# Patient Record
Sex: Female | Born: 1995 | Race: Black or African American | Hispanic: No | Marital: Single | Smoking: Never smoker
Health system: Southern US, Community
[De-identification: ages and names within clinical notes are randomized; demographics above are authoritative.]

## PROBLEM LIST (undated history)

## (undated) HISTORY — PX: OOPHORECTOMY: SHX86

---

## 2015-12-29 ENCOUNTER — Encounter (HOSPITAL_COMMUNITY): Payer: Self-pay | Admitting: Emergency Medicine

## 2015-12-29 ENCOUNTER — Emergency Department (HOSPITAL_COMMUNITY)
Admission: EM | Admit: 2015-12-29 | Discharge: 2015-12-29 | Disposition: A | Discharge status: Home / Self Care | Payer: BC Managed Care – PPO | Attending: Emergency Medicine | Admitting: Emergency Medicine

## 2015-12-29 ENCOUNTER — Emergency Department (HOSPITAL_COMMUNITY): Payer: BC Managed Care – PPO

## 2015-12-29 DIAGNOSIS — S8991XA Unspecified injury of right lower leg, initial encounter: Secondary | ICD-10-CM | POA: Diagnosis present

## 2015-12-29 DIAGNOSIS — M9251 Juvenile osteochondrosis of tibia and fibula, right leg: Secondary | ICD-10-CM | POA: Diagnosis not present

## 2015-12-29 DIAGNOSIS — S8001XA Contusion of right knee, initial encounter: Secondary | ICD-10-CM | POA: Insufficient documentation

## 2015-12-29 DIAGNOSIS — Y999 Unspecified external cause status: Secondary | ICD-10-CM | POA: Diagnosis not present

## 2015-12-29 DIAGNOSIS — Y929 Unspecified place or not applicable: Secondary | ICD-10-CM | POA: Insufficient documentation

## 2015-12-29 DIAGNOSIS — Y9302 Activity, running: Secondary | ICD-10-CM | POA: Insufficient documentation

## 2015-12-29 DIAGNOSIS — M25561 Pain in right knee: Secondary | ICD-10-CM

## 2015-12-29 DIAGNOSIS — M92521 Juvenile osteochondrosis of tibia tubercle, right leg: Secondary | ICD-10-CM

## 2015-12-29 DIAGNOSIS — M76891 Other specified enthesopathies of right lower limb, excluding foot: Secondary | ICD-10-CM | POA: Insufficient documentation

## 2015-12-29 DIAGNOSIS — W010XXA Fall on same level from slipping, tripping and stumbling without subsequent striking against object, initial encounter: Secondary | ICD-10-CM | POA: Insufficient documentation

## 2015-12-29 DIAGNOSIS — M779 Enthesopathy, unspecified: Secondary | ICD-10-CM

## 2015-12-29 MED ORDER — IBUPROFEN 400 MG PO TABS
800.0000 mg | ORAL_TABLET | Freq: Once | ORAL | Status: AC
  Administered 2015-12-29: 800 mg via ORAL
  Filled 2015-12-29: qty 2

## 2015-12-29 NOTE — Discharge Instructions (Signed)
Wear knee sleeve for at least 2 weeks for stabilization of knee. Use crutches as needed for comfort. Ice and elevate knee throughout the day, using ice pack for no more than 20 minutes every hour. Alternate between tylenol and ibuprofen for pain relief. Call orthopedic follow up today or tomorrow to schedule followup appointment for recheck of ongoing knee pain in 1 week for ongoing management of your knee pain/injury. Return to the ER for changes or worsening symptoms.

## 2015-12-29 NOTE — ED Provider Notes (Signed)
[facility]-EMERGENCY DEPT Provider Note   CSN: [id number] Arrival date & time: 12/29/15  1509  By signing my name below, I, Placido Sou, attest that this documentation has been prepared under the direction and in the presence of Irl Bodie Camprubi-Soms, PA-C. Electronically Signed: Placido Sou, ED Scribe. 12/29/15. 3:28 PM.   History   Chief Complaint Chief Complaint  Patient presents with  . Knee Pain   HPI HPI Comments: Jillian Mendoza is a 20 y.o. female with a PMHx of ?osgood-schlatter's (hx of prominent tibial tuberosity and knee pain when she was a child, played basketball as a kid), who presents to the Emergency Department complaining of a mechanical fall that occurred PTA. 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 She reports associated constant throbbing, non-radiating, 8/10, right knee pain that worsens when bending the knee and standing and with no tx tried PTA. Associated symptoms includes mild swelling to the right knee. Pt reports a h/o right knee pain as a kid, had a prominent tibial tuberosity but no formal diagnosis in the past, played basketball. She denies numbness, tingling, weakness, and bruising. Denies any other complaints at this time. No other injuries sustained during the fall.   The history is provided by the patient. No language interpreter was used.  Knee Pain   This is a new problem. The current episode started less than 1 hour ago. The problem occurs constantly. The problem has not changed since onset.The pain is present in the right knee. The quality of the pain is described as aching. The pain is at a severity of 8/10. The pain is moderate. Pertinent negatives include no numbness, full range of motion and no tingling. The symptoms are aggravated by activity and standing. She has tried nothing for the symptoms. The treatment provided no relief. There has been no history of extremity trauma.     No past medical history on file.  There are no active problems to display for this patient.   No past surgical history on file.  OB History    No data available       Home Medications    Prior to Admission medications   Not on File    Family History No family history on file.  Social History Social History  Substance Use Topics  . Smoking status: Not on file  . Smokeless tobacco: Not on file  . Alcohol use Not on file     Allergies   Review of patient's allergies indicates not on file.   Review of Systems Review of Systems  Musculoskeletal: Positive for arthralgias and joint swelling.  Skin: Negative for color change and wound.  Allergic/Immunologic: Negative for immunocompromised state.  Neurological: Negative for tingling, weakness and numbness.   A complete 10 system review of systems was obtained and all systems are negative except as noted in the HPI and PMH.   Physical Exam Updated Vital Signs BP 136/68 (BP Location: Right Arm)   Pulse 79   Temp 98.2 F (36.8 C) (Oral)   Resp 18   Ht 5\' 10"  (1.778 m)   Wt 280 lb (127 kg)   LMP 12/01/2015   SpO2 100%   BMI 40.18 kg/m   Physical Exam  Constitutional: She is oriented to person, place, and time. Vital signs are normal. She appears well-developed and well-nourished.  Non-toxic appearance. No distress.  Afebrile, nontoxic, NAD  HENT:  Head: Normocephalic and atraumatic.  Mouth/Throat:  Mucous membranes are normal.  Eyes: Conjunctivae and EOM are normal. Right eye exhibits no discharge. Left eye exhibits no discharge.  Neck: Normal range of motion. Neck supple.  Cardiovascular: Normal rate and intact distal pulses.   Pulmonary/Chest: Effort normal. No respiratory distress.  Abdominal: Normal appearance. She exhibits no distension.  Musculoskeletal: Normal range of motion.       Right knee: She exhibits swelling. She exhibits normal range of motion, no effusion, no ecchymosis, no deformity, no  laceration, no erythema, normal alignment, no LCL laxity, normal patellar mobility and no MCL laxity. Tenderness found. Patellar tendon tenderness noted.       Legs: Right knee with FROM intact, no joint line TTP, but with moderate TTP to the tibial tuberosity/patellar tendon, no patellar tendon defect, positive swelling over tibial tuberosity, no effusion/deformity, no bruising or erythema, no warmth, no abnormal alignment or patellar mobility, no varus/valgus laxity, neg anterior drawer test, no crepitus.  Strength and sensation grossly intact, distal pulses intact, compartments soft  Neurological: She is alert and oriented to person, place, and time. She has normal strength. No sensory deficit.  Skin: Skin is warm, dry and intact. No rash noted.  Psychiatric: She has a normal mood and affect. Her behavior is normal.  Nursing note and vitals reviewed.   ED Treatments / Results  Labs (all labs ordered are listed, but only abnormal results are displayed) Labs Reviewed - No data to display  EKG  EKG Interpretation None       Radiology Dg Knee Complete 4 Views Right  Result Date: 12/29/2015 CLINICAL DATA:  Fall, landed right knee.  Unable to bear weight EXAM: RIGHT KNEE - COMPLETE 4+ VIEW COMPARISON:  None. FINDINGS: No definite evidence for acute displaced fracture or malalignment. Minimal degenerative spurring along the lateral joint margin with small superior and inferior patellar osteophytes. Small bony densities adjacent to the anterior tibial tuberosity, likely chronic. No large effusion. Soft tissue swelling of the infrapatellar fat. IMPRESSION: 1. No definite acute osseous abnormality 2. Mild degenerative changes Electronically Signed   By: Jasmine Pang M.D.   On: 12/29/2015 15:55    Procedures Procedures  COORDINATION OF CARE: 3:25 PM Discussed next steps with pt. Pt verbalized understanding and is agreeable with the plan.    Medications Ordered in ED Medications  ibuprofen  (ADVIL,MOTRIN) tablet 800 mg (800 mg Oral Given 12/29/15 1537)     Initial Impression / Assessment and Plan / ED Course  I have reviewed the triage vital signs and the nursing notes.  Pertinent labs & imaging results that were available during my care of the patient were reviewed by me and considered in my medical decision making (see chart for details).  Clinical Course    20 y.o. female here with R knee pain s/p fall, +swelling, tenderness over tibial tuberosity. Hx of ?osgood-schlatter's. NVI with soft compartments, ambulatory, no joint line tenderness or effusion, just TTP and swelling over tibial tuberosity. Will obtain xray and give ibuprofen, then recheck shortly.   4:01 PM Xray shows some degenerative changes, osteophytes over tibial tuberosity region which are likely chronic. I reviewed the images, looks like she could have potentially chipped a small fragment of the tibial tuberosity osteophytes off which could explain her sudden worsening swelling/pain in that area. Will give knee sleeve and crutches for comfort. Discussed RICE, tylenol/motrin, f/up with ortho in 1wk for recheck and ongoing management of her injury. I explained the diagnosis and have given explicit precautions to return to  the ER including for any other new or worsening symptoms. The patient understands and accepts the medical plan as it's been dictated and I have answered their questions. Discharge instructions concerning home care and prescriptions have been given. The patient is STABLE and is discharged to home in good condition.   I personally performed the services described in this documentation, which was scribed in my presence. The recorded information has been reviewed and is accurate.    Final Clinical Impressions(s) / ED Diagnoses   Final diagnoses:  Right knee pain  Contusion of right knee, initial encounter  Osgood-Schlatter's disease of right knee  Bone spur of other site    New  Prescriptions New Prescriptions   No medications on file     Allen DerryMercedes Camprubi-Soms, PA-C 12/29/15 1603    Donnetta HutchingBrian Cook, MD 12/30/15 2125

## 2017-09-23 ENCOUNTER — Encounter (HOSPITAL_COMMUNITY): Payer: Self-pay | Admitting: *Deleted

## 2017-09-23 ENCOUNTER — Ambulatory Visit (HOSPITAL_COMMUNITY)
Admission: EM | Admit: 2017-09-23 | Discharge: 2017-09-23 | Disposition: A | Discharge status: Home / Self Care | Payer: BC Managed Care – PPO | Attending: Internal Medicine | Admitting: Internal Medicine

## 2017-09-23 ENCOUNTER — Ambulatory Visit (INDEPENDENT_AMBULATORY_CARE_PROVIDER_SITE_OTHER): Payer: BC Managed Care – PPO

## 2017-09-23 DIAGNOSIS — W19XXXA Unspecified fall, initial encounter: Secondary | ICD-10-CM | POA: Diagnosis not present

## 2017-09-23 DIAGNOSIS — S8001XA Contusion of right knee, initial encounter: Secondary | ICD-10-CM

## 2017-09-23 DIAGNOSIS — M25561 Pain in right knee: Secondary | ICD-10-CM

## 2017-09-23 MED ORDER — NAPROXEN 500 MG PO TABS: 500.0000 mg | ORAL_TABLET | Freq: Two times a day (BID) | ORAL | 0 refills | Status: AC

## 2017-09-23 NOTE — ED Triage Notes (Signed)
Report falling onto right knee (onto floor) while at work yesterday.  Pt ambulating with limp.  CMS intact.

## 2017-09-23 NOTE — ED Provider Notes (Signed)
[facility]   [id number] 09/23/17 Arrival Time: 1315  SUBJECTIVE: History from: patient. Jillian Mendoza is a 22 y.o. female complains of right knee pain that began yesterday.  It began after she tripped and fell on right knee at work.  Localizes the pain to the front of knee.  Describes the pain as constant and "grinding" in character.  Has tried icing and tylenol without improvement.  Symptoms are made worse with bending, weight-bearing.  Reports similar symptoms in the past.  Complains of swelling.  Denies fever, chills, erythema, ecchymosis, weakness, numbness and tingling.      ROS: As per HPI.  History reviewed. No pertinent past medical history. Past Surgical History:  Procedure Laterality Date  . OOPHORECTOMY     No Known Allergies No current facility-administered medications on file prior to encounter.    No current outpatient medications on file prior to encounter.   Social History   Socioeconomic History  . Marital status: Single    Spouse name: Not on file  . Number of children: Not on file  . Years of education: Not on file  . Highest education level: Not on file  Occupational History  . Not on file  Social Needs  . Financial resource strain: Not on file  . Food insecurity:    Worry: Not on file    Inability: Not on file  . Transportation needs:    Medical: Not on file    Non-medical: Not on file  Tobacco Use  . Smoking status: Never Smoker  . Smokeless tobacco: Never Used  Substance and Sexual Activity  . Alcohol use: Yes    Comment: rarely  . Drug use: Never  . Sexual activity: Not on file  Lifestyle  . Physical activity:    Days per week: Not on file    Minutes per session: Not on file  . Stress: Not on file  Relationships  . Social connections:    Talks on phone: Not on file    Gets together: Not on file    Attends religious service: Not on file    Active member of club or organization: Not on file    Attends meetings of clubs or  organizations: Not on file    Relationship status: Not on file  . Intimate partner violence:    Fear of current or ex partner: Not on file    Emotionally abused: Not on file    Physically abused: Not on file    Forced sexual activity: Not on file  Other Topics Concern  . Not on file  Social History Narrative  . Not on file   Family History  Problem Relation Age of Onset  . Hypertension Mother   . Hypercholesterolemia Mother   . Healthy Father     OBJECTIVE:  Vitals:   09/23/17 1404 09/23/17 1405  BP:  (!) 142/70  Pulse: 98   Resp: 16   Temp: 98.6 F (37 C)   TempSrc: Oral   SpO2: 100%     General appearance: AOx3; in no acute distress. Appears state age Head: NCAT Lungs: CTA bilaterally Heart: RRR.  Clear S1 and S2 without murmur, gallops, or rubs.  Radial pulses 2+ bilaterally. Musculoskeletal: Right knee Inspection: Skin warm, dry, clear and intact without obvious erythema, or  ecchymosis. Mild effusion Palpation: Tender anteriorly at the tibial tuberosity and patella ROM: LROM, active 0-75 Strength: 5/5 knee abduction, 5/5 knee adduction, 5/5 knee flexion, 5/5 knee extension Skin: warm and dry Neurologic:  Antalgic gait; Sensation intact about the lower extremities Psychological: alert and cooperative; normal mood and affect  DIAGNOSTIC STUDIES:  CLINICAL DATA: 22 year old female status post fall yesterday with pain. Patient reports prior patella fracture.  EXAM: RIGHT KNEE - COMPLETE 4+ VIEW  COMPARISON: Right knee series 12/29/2015.  FINDINGS: The lateral view is oblique. No definite joint effusion. Bone mineralization is within normal limits. Joint spaces and alignment are preserved. Patella appears intact. Soft tissues are unremarkable.  IMPRESSION: No osseous abnormality identified about the right knee   Electronically Signed By: Odessa FlemingH Hall M.D. On: 09/23/2017 15:42  X-rays negative for bony abnormalities including fracture, or  dislocation.  No soft tissue swelling.    I have reviewed the x-rays myself and the radiologist interpretation. I am in agreement with the radiologist interpretation.    ASSESSMENT & PLAN:  1. Contusion of right knee, initial encounter   2. Acute pain of right knee     Meds ordered this encounter  Medications  . naproxen (NAPROSYN) 500 MG tablet    Sig: Take 1 tablet (500 mg total) by mouth 2 (two) times daily.    Dispense:  30 tablet    Refill:  0    Order Specific Question:   Supervising Provider    Answer:   Isa RankinMURRAY, LAURA WILSON [[id number]]   Knee brace and crutches given Continue conservative management of rest, ice, and elevate leg Take naproxen as needed for pain relief (may cause abdominal discomfort, ulcers, and GI bleeds avoid taking with other NSAIDs) Follow up with PCP or with orthopedist if symptoms persist Return or go to the ER if you have any new or worsening symptoms (fever, chills, chest pain, abdominal pain, changes in bowel or bladder habits, pain radiating into lower legs, etc...)   Reviewed expectations re: course of current medical issues. Questions answered. Outlined signs and symptoms indicating need for more acute intervention. Patient verbalized understanding. After Visit Summary given.    Rennis HardingWurst, [place] Greb, PA-C 09/23/17 1608

## 2017-09-23 NOTE — Discharge Instructions (Signed)
Knee brace and crutches given Continue conservative management of rest, ice, and elevate leg Take naproxen as needed for pain relief (may cause abdominal discomfort, ulcers, and GI bleeds avoid taking with other NSAIDs) Follow up with PCP or with orthopedist if symptoms persist Return or go to the ER if you have any new or worsening symptoms (fever, chills, chest pain, abdominal pain, changes in bowel or bladder habits, pain radiating into lower legs, etc...)

## 2019-03-13 IMAGING — DX DG KNEE COMPLETE 4+V*R*
4 series · 5 of 5 positions shown · non-contrast
Comparison: Right knee series 12/29/2015.

CLINICAL DATA: 22-year-old female status post fall yesterday with
pain. Patient reports prior patella fracture.

EXAM:
RIGHT KNEE - COMPLETE 4+ VIEW

[knee ap]
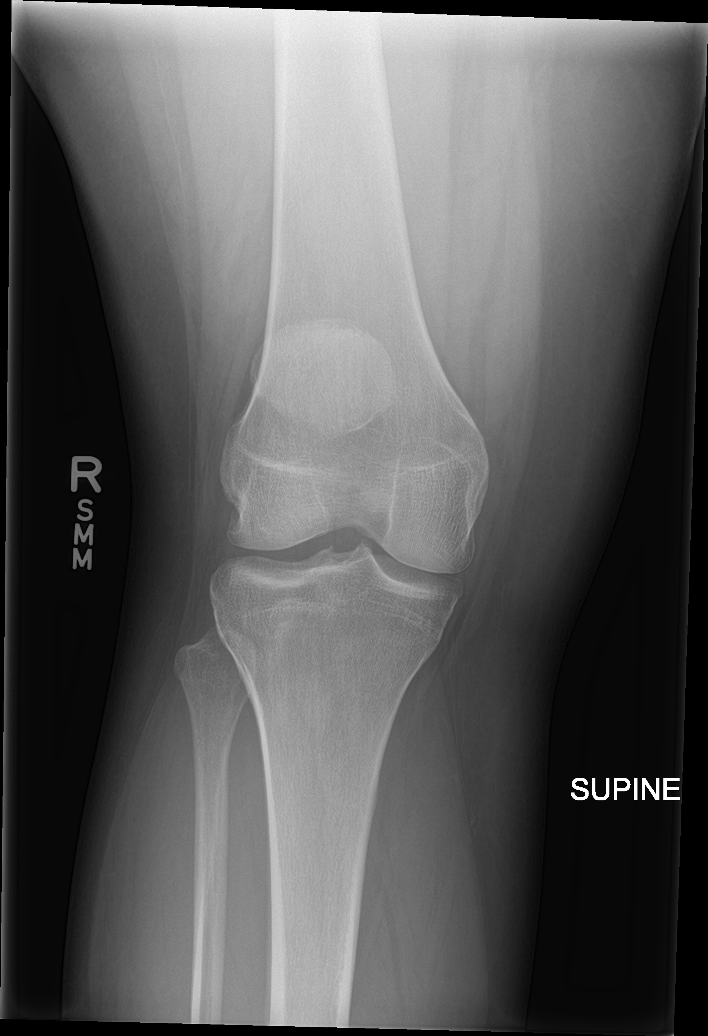

[knee obl (1 of 2)]
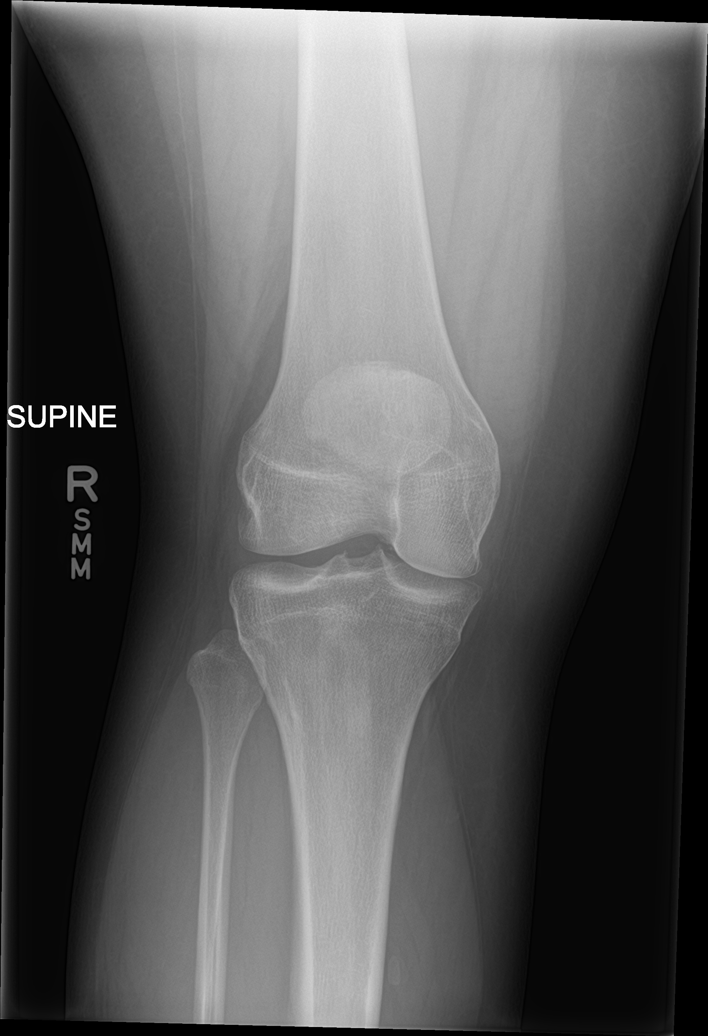

[knee obl (2 of 2)]
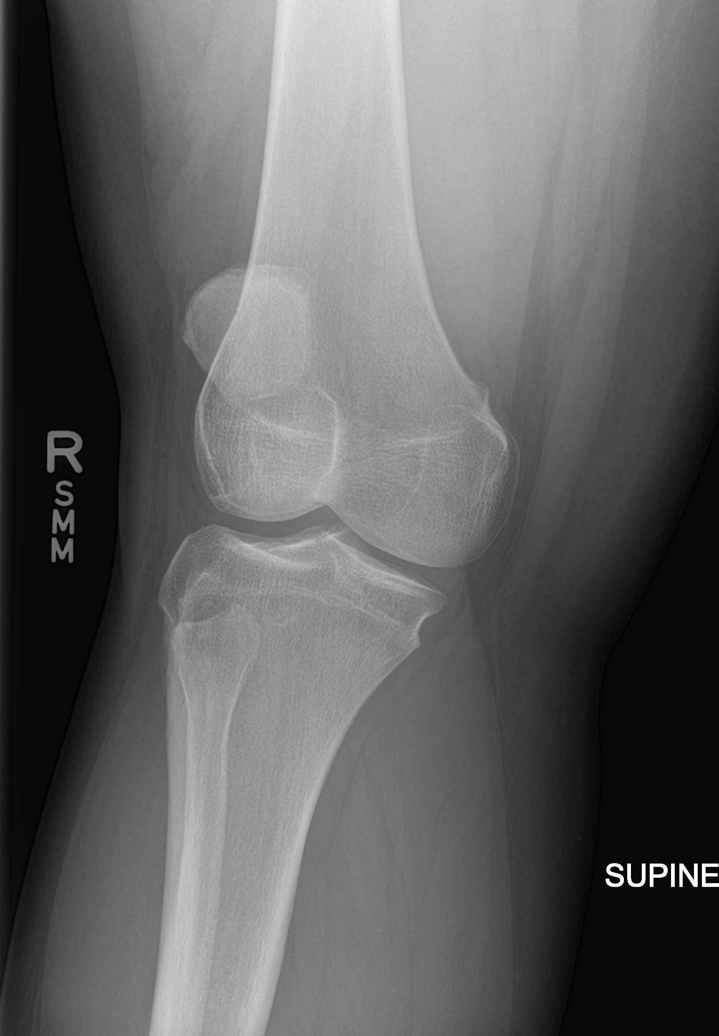

[Series 4: knee lat · 0.14mm/px · 2 of 2 slices shown]
[im 1/2]
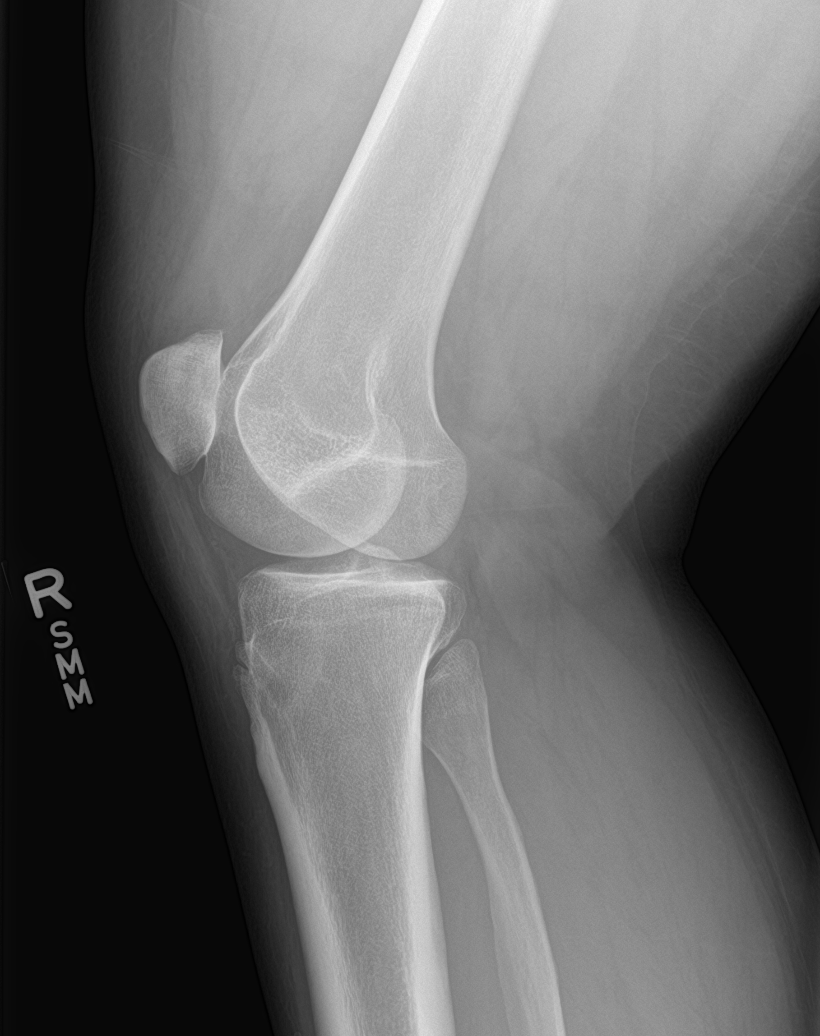
[im 2/2]
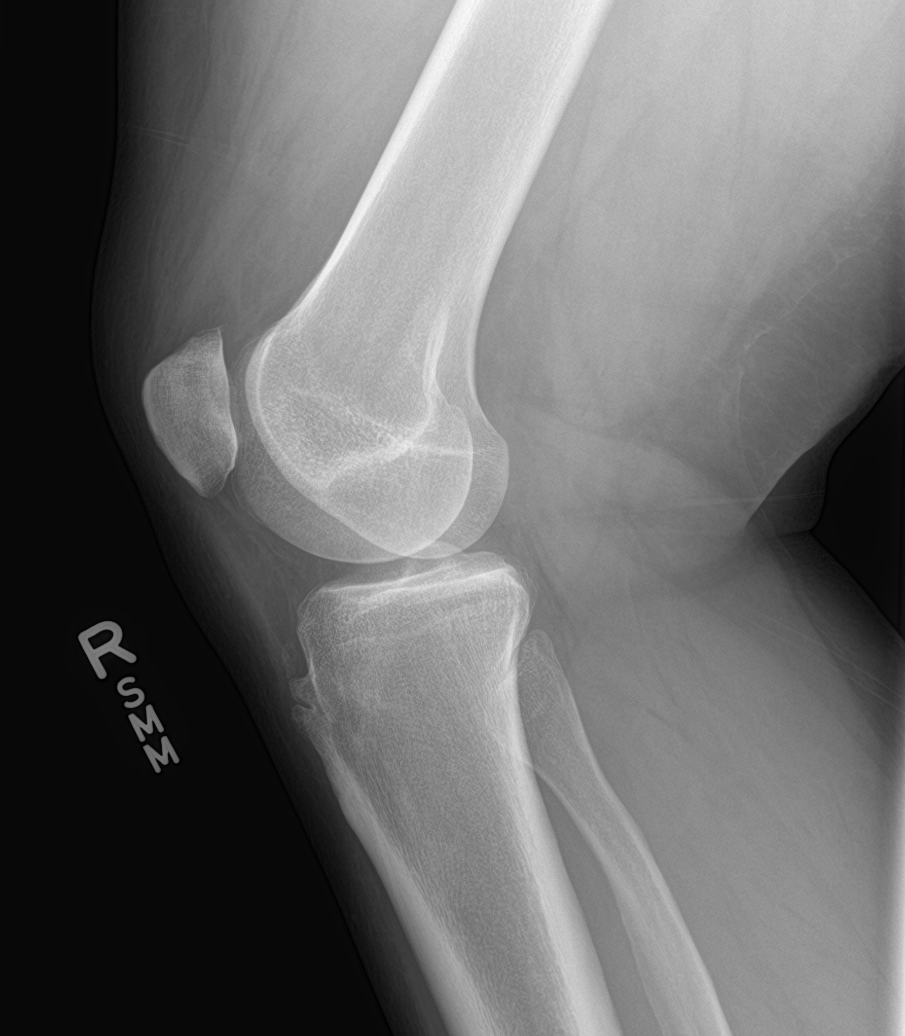

[5 of 5 positions shown; findings below may reference images not displayed]

FINDINGS: The lateral view is oblique. No definite joint effusion. Bone
mineralization is within normal limits. Joint spaces and alignment
are preserved. Patella appears intact. Soft tissues are
unremarkable.
IMPRESSION: No osseous abnormality identified about the right knee

## 2021-04-04 ENCOUNTER — Encounter (HOSPITAL_BASED_OUTPATIENT_CLINIC_OR_DEPARTMENT_OTHER): Payer: Self-pay

## 2021-04-04 ENCOUNTER — Emergency Department (HOSPITAL_BASED_OUTPATIENT_CLINIC_OR_DEPARTMENT_OTHER)
Admission: EM | Admit: 2021-04-04 | Discharge: 2021-04-05 | Disposition: A | Discharge status: Home / Self Care | Payer: BC Managed Care – PPO | Attending: Emergency Medicine | Admitting: Emergency Medicine

## 2021-04-04 DIAGNOSIS — K6289 Other specified diseases of anus and rectum: Secondary | ICD-10-CM | POA: Diagnosis present

## 2021-04-04 DIAGNOSIS — K644 Residual hemorrhoidal skin tags: Secondary | ICD-10-CM | POA: Insufficient documentation

## 2021-04-04 DIAGNOSIS — K649 Unspecified hemorrhoids: Secondary | ICD-10-CM

## 2021-04-04 MED ORDER — HYDROCORTISONE ACETATE 25 MG RE SUPP: 25.0000 mg | Freq: Two times a day (BID) | RECTAL | 0 refills | Status: AC

## 2021-04-04 NOTE — Discharge Instructions (Signed)
You were seen today for ongoing hemorrhoids.  Continue your stool softener and MiraLAX.  Continue sitz bath's.  Start Anusol suppositories.  If you continue to have issues, follow-up with general surgery.

## 2021-04-04 NOTE — ED Triage Notes (Signed)
Patient states that she is having pain in her rectal area that has been going on for about 2 weeks. Patient reports that she has hx of Hemorrhoids.

## 2021-04-04 NOTE — ED Provider Notes (Signed)
[facility] EMERGENCY DEPT Provider Note   CSN: [id number] Arrival date & time: 04/04/21  2315     History Chief Complaint  Patient presents with   Hemorrhoids    Jillian Mendoza is a 25 y.o. female.  HPI     This is a 25 year old female who presents with concern for rectal pain.  Patient reports that 2 weeks ago she was seen at urgent care and noted to have a hemorrhoid.  Since that time she has been using stool softeners, MiraLAX and sitz bath.  She continues to have pain.  It is mostly with sitting.  She states that her stools are soft.  She is not noted any blood.  No fevers.  She reports ongoing pain at 7 out of 10.  History reviewed. No pertinent past medical history.  There are no problems to display for this patient.   Past Surgical History:  Procedure Laterality Date   OOPHORECTOMY       OB History   No obstetric history on file.     Family History  Problem Relation Age of Onset   Hypertension Mother    Hypercholesterolemia Mother    Healthy Father     Social History   Tobacco Use   Smoking status: Never   Smokeless tobacco: Never  Vaping Use   Vaping Use: Never used  Substance Use Topics   Alcohol use: Yes    Comment: rarely   Drug use: Never    Home Medications Prior to Admission medications   Medication Sig Start Date End Date Taking? Authorizing Provider  hydrocortisone (ANUSOL-HC) 25 MG suppository Place 1 suppository (25 mg total) rectally 2 (two) times daily. 04/04/21  Yes Ahmaya Ostermiller, Mayer Masker, MD  naproxen (NAPROSYN) 500 MG tablet Take 1 tablet (500 mg total) by mouth 2 (two) times daily. 09/23/17   Wurst, [place], PA-C    Allergies    Shellfish allergy  Review of Systems   Review of Systems  Constitutional:  Negative for fever.  Gastrointestinal:  Negative for abdominal pain and constipation.       Rectal pain  All other systems reviewed and are negative.  Physical Exam Updated Vital Signs BP (!) 163/114 (BP  Location: Right Arm)    Pulse (!) 125    Temp 98.2 F (36.8 C)    Resp 20    Ht 1.753 m (5\' 9" )    Wt (!) 145.2 kg    LMP 03/21/2021    SpO2 100%    BMI 47.26 kg/m   Physical Exam Vitals and nursing note reviewed. Exam conducted with a chaperone present.  Constitutional:      Appearance: She is well-developed.     Comments: Morbidly obese  HENT:     Head: Normocephalic and atraumatic.     Mouth/Throat:     Mouth: Mucous membranes are moist.  Eyes:     Pupils: Pupils are equal, round, and reactive to light.  Cardiovascular:     Rate and Rhythm: Normal rate and regular rhythm.  Pulmonary:     Effort: Pulmonary effort is normal. No respiratory distress.  Abdominal:     Palpations: Abdomen is soft.  Genitourinary:      Comments: No fluctuance or tenderness noted in the gluteal cleft or in perineal region, no fistulas or fluctuance noted with digital rectal exam, there is an external hemorrhoid as noted Musculoskeletal:     Cervical back: Neck supple.  Skin:    General: Skin is warm and dry.  Neurological:     Mental Status: She is alert and oriented to person, place, and time.  Psychiatric:        Mood and Affect: Mood normal.    ED Results / Procedures / Treatments   Labs (all labs ordered are listed, but only abnormal results are displayed) Labs Reviewed - No data to display  EKG None  Radiology No results found.  Procedures Procedures   Medications Ordered in ED Medications - No data to display  ED Course  I have reviewed the triage vital signs and the nursing notes.  Pertinent labs & imaging results that were available during my care of the patient were reviewed by me and considered in my medical decision making (see chart for details).    MDM Rules/Calculators/A&P                          Patient presents with ongoing rectal pain.  She is nontoxic and vital signs are reassuring.  Patient reports ongoing pain despite treatment at home.  On physical exam,  there is no evidence of abscess.  No evidence of pilonidal.  No evidence of fistula.  I suspect her pain is from her external hemorrhoid.  No evidence of thrombosis or active bleeding.  We will initiate Anusol suppositories and have her continue her ongoing supportive measures at home.  She was given surgery follow-up.  After history, exam, and medical workup I feel the patient has been appropriately medically screened and is safe for discharge home. Pertinent diagnoses were discussed with the patient. Patient was given return precautions.      Final Clinical Impression(s) / ED Diagnoses Final diagnoses:  Hemorrhoids, unspecified hemorrhoid type    Rx / DC Orders ED Discharge Orders          Ordered    hydrocortisone (ANUSOL-HC) 25 MG suppository  2 times daily        04/04/21 2353             Shon Baton, MD 04/04/21 2357
